# Patient Record
Sex: Female | Born: 1975 | Race: White | Hispanic: No | Smoking: Never smoker
Health system: Southern US, Community
[De-identification: ages and names within clinical notes are randomized; demographics above are authoritative.]

## PROBLEM LIST (undated history)

## (undated) DIAGNOSIS — B019 Varicella without complication: Secondary | ICD-10-CM

## (undated) DIAGNOSIS — K219 Gastro-esophageal reflux disease without esophagitis: Secondary | ICD-10-CM

## (undated) HISTORY — DX: Gastro-esophageal reflux disease without esophagitis: K21.9

## (undated) HISTORY — PX: TUBAL LIGATION: SHX77

## (undated) HISTORY — DX: Varicella without complication: B01.9

---

## 2006-05-05 HISTORY — PX: AUGMENTATION MAMMAPLASTY: SUR837

## 2011-05-06 HISTORY — PX: CHOLECYSTECTOMY: SHX55

## 2014-06-23 DIAGNOSIS — K219 Gastro-esophageal reflux disease without esophagitis: Secondary | ICD-10-CM | POA: Insufficient documentation

## 2015-01-30 DIAGNOSIS — N920 Excessive and frequent menstruation with regular cycle: Secondary | ICD-10-CM

## 2015-01-30 DIAGNOSIS — N921 Excessive and frequent menstruation with irregular cycle: Secondary | ICD-10-CM | POA: Insufficient documentation

## 2015-01-30 DIAGNOSIS — Z8041 Family history of malignant neoplasm of ovary: Secondary | ICD-10-CM | POA: Insufficient documentation

## 2015-01-30 HISTORY — DX: Excessive and frequent menstruation with regular cycle: N92.0

## 2018-02-19 LAB — HM PAP SMEAR: HM Pap smear: NORMAL

## 2020-05-15 ENCOUNTER — Ambulatory Visit: Payer: Self-pay | Admitting: Physician Assistant

## 2020-07-03 ENCOUNTER — Ambulatory Visit: Payer: Self-pay | Admitting: Family Medicine

## 2020-10-12 ENCOUNTER — Encounter: Payer: Self-pay | Admitting: Family Medicine

## 2020-10-12 ENCOUNTER — Other Ambulatory Visit: Payer: Self-pay

## 2020-10-12 ENCOUNTER — Ambulatory Visit (INDEPENDENT_AMBULATORY_CARE_PROVIDER_SITE_OTHER): Payer: Managed Care, Other (non HMO) | Admitting: Family Medicine

## 2020-10-12 VITALS — BP 118/74 | HR 60 | Temp 98.3°F | Ht 65.5 in | Wt 206.0 lb

## 2020-10-12 DIAGNOSIS — E611 Iron deficiency: Secondary | ICD-10-CM

## 2020-10-12 DIAGNOSIS — Z1211 Encounter for screening for malignant neoplasm of colon: Secondary | ICD-10-CM

## 2020-10-12 DIAGNOSIS — E669 Obesity, unspecified: Secondary | ICD-10-CM | POA: Insufficient documentation

## 2020-10-12 DIAGNOSIS — Z131 Encounter for screening for diabetes mellitus: Secondary | ICD-10-CM | POA: Diagnosis not present

## 2020-10-12 DIAGNOSIS — N921 Excessive and frequent menstruation with irregular cycle: Secondary | ICD-10-CM

## 2020-10-12 DIAGNOSIS — Z0000 Encounter for general adult medical examination without abnormal findings: Secondary | ICD-10-CM

## 2020-10-12 DIAGNOSIS — Z1159 Encounter for screening for other viral diseases: Secondary | ICD-10-CM

## 2020-10-12 DIAGNOSIS — Z1231 Encounter for screening mammogram for malignant neoplasm of breast: Secondary | ICD-10-CM

## 2020-10-12 LAB — CBC WITH DIFFERENTIAL/PLATELET
Basophils Absolute: 0.1 10*3/uL (ref 0.0–0.1)
Basophils Relative: 1.1 % (ref 0.0–3.0)
Eosinophils Absolute: 0.1 10*3/uL (ref 0.0–0.7)
Eosinophils Relative: 1.7 % (ref 0.0–5.0)
HCT: 39.5 % (ref 36.0–46.0)
Hemoglobin: 13.2 g/dL (ref 12.0–15.0)
Lymphocytes Relative: 33.4 % (ref 12.0–46.0)
Lymphs Abs: 1.8 10*3/uL (ref 0.7–4.0)
MCHC: 33.3 g/dL (ref 30.0–36.0)
MCV: 85.2 fl (ref 78.0–100.0)
Monocytes Absolute: 0.4 10*3/uL (ref 0.1–1.0)
Monocytes Relative: 7 % (ref 3.0–12.0)
Neutro Abs: 3 10*3/uL (ref 1.4–7.7)
Neutrophils Relative %: 56.8 % (ref 43.0–77.0)
Platelets: 289 10*3/uL (ref 150.0–400.0)
RBC: 4.64 Mil/uL (ref 3.87–5.11)
RDW: 13.8 % (ref 11.5–15.5)
WBC: 5.3 10*3/uL (ref 4.0–10.5)

## 2020-10-12 LAB — COMPREHENSIVE METABOLIC PANEL
ALT: 16 U/L (ref 0–35)
AST: 14 U/L (ref 0–37)
Albumin: 4.3 g/dL (ref 3.5–5.2)
Alkaline Phosphatase: 74 U/L (ref 39–117)
BUN: 11 mg/dL (ref 6–23)
CO2: 25 mEq/L (ref 19–32)
Calcium: 9.1 mg/dL (ref 8.4–10.5)
Chloride: 104 mEq/L (ref 96–112)
Creatinine, Ser: 0.78 mg/dL (ref 0.40–1.20)
GFR: 90.77 mL/min (ref >60.00)
Glucose, Bld: 111 mg/dL — ABNORMAL HIGH (ref 70–99)
Potassium: 4.6 mEq/L (ref 3.5–5.1)
Sodium: 137 mEq/L (ref 135–145)
Total Bilirubin: 0.5 mg/dL (ref 0.2–1.2)
Total Protein: 8 g/dL (ref 6.0–8.3)

## 2020-10-12 LAB — LIPID PANEL
Cholesterol: 200 mg/dL (ref 0–200)
HDL: 50.6 mg/dL (ref >39.00)
LDL Cholesterol: 129 mg/dL — ABNORMAL HIGH (ref 0–99)
NonHDL: 149.27
Total CHOL/HDL Ratio: 4
Triglycerides: 99 mg/dL (ref 0.0–149.0)
VLDL: 19.8 mg/dL (ref 0.0–40.0)

## 2020-10-12 LAB — TSH: TSH: 4.49 u[IU]/mL (ref 0.35–4.50)

## 2020-10-12 LAB — HEMOGLOBIN A1C: Hgb A1c MFr Bld: 5.7 % (ref 4.6–6.5)

## 2020-10-12 NOTE — Patient Instructions (Addendum)
Great to see you today.  .   If labs were collected, we will inform you of lab results once received either by echart message or telephone call.   - echart message- for normal results that have been seen by the patient already.   - telephone call: abnormal results or if patient has not viewed results in their echart.   Health Maintenance, Female Adopting a healthy lifestyle and getting preventive care are important in promoting health and wellness. Ask your health care provider about: The right schedule for you to have regular tests and exams. Things you can do on your own to prevent diseases and keep yourself healthy. What should I know about diet, weight, and exercise? Eat a healthy diet  Eat a diet that includes plenty of vegetables, fruits, low-fat dairy products, and lean protein. Do not eat a lot of foods that are high in solid fats, added sugars, or sodium.  Maintain a healthy weight Body mass index (BMI) is used to identify weight problems. It estimates body fat based on height and weight. Your health care provider can help determineyour BMI and help you achieve or maintain a healthy weight. Get regular exercise Get regular exercise. This is one of the most important things you can do for your health. Most adults should: Exercise for at least 150 minutes each week. The exercise should increase your heart rate and make you sweat (moderate-intensity exercise). Do strengthening exercises at least twice a week. This is in addition to the moderate-intensity exercise. Spend less time sitting. Even light physical activity can be beneficial. Watch cholesterol and blood lipids Have your blood tested for lipids and cholesterol at 47 years of age, then havethis test every 5 years. Have your cholesterol levels checked more often if: Your lipid or cholesterol levels are high. You are older than 47 years of age. You are at high risk for heart disease. What should I know about cancer  screening? Depending on your health history and family history, you may need to have cancer screening at various ages. This may include screening for: Breast cancer. Cervical cancer. Colorectal cancer. Skin cancer. Lung cancer. What should I know about heart disease, diabetes, and high blood pressure? Blood pressure and heart disease High blood pressure causes heart disease and increases the risk of stroke. This is more likely to develop in people who have high blood pressure readings, are of African descent, or are overweight. Have your blood pressure checked: Every 3-5 years if you are 33-54 years of age. Every year if you are 23 years old or older. Diabetes Have regular diabetes screenings. This checks your fasting blood sugar level. Have the screening done: Once every three years after age 61 if you are at a normal weight and have a low risk for diabetes. More often and at a younger age if you are overweight or have a high risk for diabetes. What should I know about preventing infection? Hepatitis B If you have a higher risk for hepatitis B, you should be screened for this virus. Talk with your health care provider to find out if you are at risk forhepatitis B infection. Hepatitis C Testing is recommended for: Everyone born from [birth year] through 1967. Anyone with known risk factors for hepatitis C. Sexually transmitted infections (STIs) Get screened for STIs, including gonorrhea and chlamydia, if: You are sexually active and are younger than 47 years of age. You are older than 47 years of age and your health care provider tells you that  you are at risk for this type of infection. Your sexual activity has changed since you were last screened, and you are at increased risk for chlamydia or gonorrhea. Ask your health care provider if you are at risk. Ask your health care provider about whether you are at high risk for HIV. Your health care provider may recommend a prescription medicine to  help prevent HIV infection. If you choose to take medicine to prevent HIV, you should first get tested for HIV. You should then be tested every 3 months for as long as you are taking the medicine. Pregnancy If you are about to stop having your period (premenopausal) and you may become pregnant, seek counseling before you get pregnant. Take 400 to 800 micrograms (mcg) of folic acid every day if you become pregnant. Ask for birth control (contraception) if you want to prevent pregnancy. Osteoporosis and menopause Osteoporosis is a disease in which the bones lose minerals and strength with aging. This can result in bone fractures. If you are 61 years old or older, or if you are at risk for osteoporosis and fractures, ask your health care provider if you should: Be screened for bone loss. Take a calcium or vitamin D supplement to lower your risk of fractures. Be given hormone replacement therapy (HRT) to treat symptoms of menopause. Follow these instructions at home: Lifestyle Do not use any products that contain nicotine or tobacco, such as cigarettes, e-cigarettes, and chewing tobacco. If you need help quitting, ask your health care provider. Do not use street drugs. Do not share needles. Ask your health care provider for help if you need support or information about quitting drugs. Alcohol use Do not drink alcohol if: Your health care provider tells you not to drink. You are pregnant, may be pregnant, or are planning to become pregnant. If you drink alcohol: Limit how much you use to 0-1 drink a day. Limit intake if you are breastfeeding. Be aware of how much alcohol is in your drink. In the [place] one drink equals one 12 oz bottle of beer (355 mL), one 5 oz glass of wine (148 mL), or one 1 oz glass of hard liquor (44 mL). General instructions Schedule regular health, dental, and eye exams. Stay current with your vaccines. Tell your health care provider if: You often feel depressed. You  have ever been abused or do not feel safe at home. Summary Adopting a healthy lifestyle and getting preventive care are important in promoting health and wellness. Follow your health care provider's instructions about healthy diet, exercising, and getting tested or screened for diseases. Follow your health care provider's instructions on monitoring your cholesterol and blood pressure. This information is not intended to replace advice given to you by your health care provider. Make sure you discuss any questions you have with your healthcare provider. Document Revised: 04/14/2018 Document Reviewed: 04/14/2018 Elsevier Patient Education  2022 [employer].    If you are age 9 or younger, your body mass index should be between 19-25. Your Body mass index is Body mass index is 33.76 kg/m. Marland Kitchen If this is above the aformentioned range listed, you are consider overwieght and obese if BMI > 30, by medical definition and standards. Routine daily exercise and dietary modifications are encouraged. If you would like additional guidance on weight loss, please make an appointment for weight loss counseling - must be an appointment dedicate to weight loss counseling alone. I would be happy to help you.

## 2020-10-12 NOTE — Progress Notes (Signed)
Patient ID: Christine Price, female  DOB: 1976/04/24, 45 y.o.   MRN: [id number] Patient Care Team    Relationship Specialty Notifications Start End  Ma Hillock, DO PCP - General Family Medicine  10/12/20     Chief Complaint  Patient presents with   Establish Care    Pt is fasting; no meds, no prior PCP in last 10 years, dr Harrell Gave scott nunes, no concerns    Subjective:  Christine Price is a 45 y.o.  female present for new patient establishment/cpe. All past medical history, surgical history, allergies, family history, immunizations, medications and social history were updated in the electronic medical record today. All recent labs, ED visits and hospitalizations within the last year were reviewed.  Health maintenance:  Colonoscopy: No family history of colon cancer.  Discussed different options today and patient would like to start with Cologuard testing.  Ordered for her today. Mammogram: completed:2020- GYN Cervical cancer screening: last pap: 09/2018- GYN, Immunizations: tdap - she believes last 10 yrs, Influenza (encouraged yearly),  covid x2 Infectious disease screening: HIV w/ preg, Hep C completed.  DEXA: routine screen Assistive device: none Oxygen KNL:ZJQB Patient has a Dental home. Hospitalizations/ED visits:review  Depression screen [facility] 2/9 10/12/2020  Decreased Interest 0  Down, Depressed, Hopeless 0  PHQ - 2 Score 0   No flowsheet data found.      No flowsheet data found.  Immunization History  Administered Date(s) Administered   PFIZER(Purple Top)SARS-COV-2 Vaccination 12/20/2019, 01/06/2020    No results found.  Past Medical History:  Diagnosis Date   Chicken pox    GERD (gastroesophageal reflux disease)    Menorrhagia 01/30/2015   No Known Allergies Past Surgical History:  Procedure Laterality Date   CHOLECYSTECTOMY  2013   TUBAL LIGATION     Family History  Problem Relation Age of Onset   Cancer Father    Drug abuse Sister    COPD  Sister    Learning disabilities Daughter    Cervical cancer Maternal Grandmother    Throat cancer Maternal Grandmother    Heart disease Maternal Grandfather    Lung cancer Maternal Grandfather        smoker   Coronary artery disease Maternal Grandfather    Social History   Social History Narrative   Marital status/children/pets: Divorced, 3 children   Education/employment: colleg educated, works in Programmer, applications.    Safety:      -smoke alarm in the home:Yes     - wears seatbelt: Yes     - Feels safe in their relationships: Yes       Allergies as of 10/12/2020   No Known Allergies      Medication List    as of October 12, 2020  8:43 AM   You have not been prescribed any medications.     All past medical history, surgical history, allergies, family history, immunizations andmedications were updated in the EMR today and reviewed under the history and medication portions of their EMR.    No results found for this or any previous visit (from the past 2160 hour(s)).  Patient was never admitted.   ROS: 14 pt review of systems performed and negative (unless mentioned in an HPI)  Objective: BP 118/74   Pulse 60   Temp 98.3 F (36.8 C) (Oral)   Ht 5' 5.5" (1.664 m)   Wt 206 lb (93.4 kg)   LMP 09/22/2020 (Approximate)   SpO2 98%   BMI 33.76 kg/m  Gen: Afebrile. No acute distress. Nontoxic in appearance, well-developed, well-nourished, very pleasant obese female HENT: AT. [place]. Bilateral TM visualized and normal in appearance, normal external auditory canal. MMM, no oral lesions, adequate dentition. Bilateral nares within normal limits. Throat without erythema, ulcerations or exudates.  No cough on exam, no hoarseness on exam. Eyes:Pupils Equal Round Reactive to light, Extraocular movements intact,  Conjunctiva without redness, discharge or icterus. Neck/lymp/endocrine: Supple, no lymphadenopathy, no thyromegaly CV: RRR no murmur, no edema, +2/4 P posterior tibialis pulses.   Chest: CTAB, no wheeze, rhonchi or crackles.  Normal respiratory effort.  Good air movement. Abd: Soft.  Flat. NTND. BS present.  No masses palpated. No hepatosplenomegaly. No rebound tenderness or guarding. Skin: No rashes, purpura or petechiae. Warm and well-perfused. Skin intact. Neuro/Msk:  Normal gait. PERLA. EOMi. Alert. Oriented x3.  Cranial nerves II through XII intact. Muscle strength 5/5 upper/lower extremity. DTRs equal bilaterally. Psych: Normal affect, dress and demeanor. Normal speech. Normal thought content and judgment.   Assessment/plan: Christine Price is a 45 y.o. female present for establish care/CPE Obesity (BMI 30-39.9) Routine exercise and dietary modification encouraged - Lipid panel Diabetes mellitus screening - Comp Met (CMET) - Hemoglobin A1c  Menorrhagia with irregular cycle/iron deficiency anemia Is having heavy and irregular periods she reports she did have an endometrial biopsy in the past. - CBC w/Diff - TSH - Ambulatory referral to Gynecology Iron deficiency - Iron, TIBC and Ferritin Panel Encounter for hepatitis C screening test for low risk patient - Hepatitis C Antibody Colon cancer screening - Cologuard Encounter for screening mammogram for malignant neoplasm of breast - MM 3D SCREEN BREAST BILATERAL; Future  Routine general medical examination at a health care facility/Encounter for medical examination to establish care Colonoscopy: No family history of colon cancer.  Discussed different options today and patient would like to start with Cologuard testing.  Ordered for her today. Mammogram: completed:2020- GYN Cervical cancer screening: last pap: 09/2018- GYN, Immunizations: tdap - she believes last 10 yrs, Influenza (encouraged yearly),  covid x2 Infectious disease screening: HIV w/ preg, Hep C completed.  DEXA: routine screen Patient was encouraged to exercise greater than 150 minutes a week. Patient was encouraged to choose a diet filled  with fresh fruits and vegetables, and lean meats. AVS provided to patient today for education/recommendation on gender specific health and safety maintenance. Return in about 1 year (around 10/12/2021) for CPE (30 min).  Orders Placed This Encounter  Procedures   CBC w/Diff   Comp Met (CMET)   TSH   Hemoglobin A1c   Lipid panel    No orders of the defined types were placed in this encounter.  Referral Orders  No referral(s) requested today     Note is dictated utilizing voice recognition software. Although note has been proof read prior to signing, occasional typographical errors still can be missed. If any questions arise, please do not hesitate to call for verification.  Electronically signed by: Howard Pouch, DO [facility]

## 2020-10-15 ENCOUNTER — Telehealth: Payer: Self-pay | Admitting: Family Medicine

## 2020-10-15 DIAGNOSIS — R768 Other specified abnormal immunological findings in serum: Secondary | ICD-10-CM

## 2020-10-15 NOTE — Telephone Encounter (Signed)
Pt called. Discussed  positive Hep C test.  Referred to ID.

## 2020-10-18 ENCOUNTER — Other Ambulatory Visit (HOSPITAL_COMMUNITY): Payer: Self-pay

## 2020-10-18 ENCOUNTER — Telehealth: Payer: Self-pay

## 2020-10-18 ENCOUNTER — Ambulatory Visit (INDEPENDENT_AMBULATORY_CARE_PROVIDER_SITE_OTHER): Payer: Managed Care, Other (non HMO)

## 2020-10-18 ENCOUNTER — Other Ambulatory Visit: Payer: Self-pay

## 2020-10-18 DIAGNOSIS — Z1231 Encounter for screening mammogram for malignant neoplasm of breast: Secondary | ICD-10-CM

## 2020-10-18 NOTE — Telephone Encounter (Signed)
[facility] Encounter  Insurance verification completed.    The patient is insured through [employer].  Medication will need a PA.  We will continue to follow to see if copay assistance is needed.  Cristela Stalder, CPhT [facility] Phone: [phone] Fax:  [phone]  

## 2020-10-19 ENCOUNTER — Other Ambulatory Visit: Payer: Self-pay

## 2020-10-19 ENCOUNTER — Ambulatory Visit (INDEPENDENT_AMBULATORY_CARE_PROVIDER_SITE_OTHER): Payer: Managed Care, Other (non HMO) | Admitting: Family

## 2020-10-19 ENCOUNTER — Encounter: Payer: Self-pay | Admitting: Family

## 2020-10-19 VITALS — BP 146/80 | HR 58 | Temp 97.9°F | Wt 208.0 lb

## 2020-10-19 DIAGNOSIS — R768 Other specified abnormal immunological findings in serum: Secondary | ICD-10-CM | POA: Diagnosis not present

## 2020-10-19 LAB — IRON,TIBC AND FERRITIN PANEL
%SAT: 27 % (calc) (ref 16–45)
Ferritin: 16 ng/mL (ref 16–232)
Iron: 93 ug/dL (ref 40–190)
TIBC: 346 mcg/dL (calc) (ref 250–450)

## 2020-10-19 LAB — HEPATITIS C ANTIBODY
Hepatitis C Ab: REACTIVE — AB
SIGNAL TO CUT-OFF: 7.29 — ABNORMAL HIGH (ref <1.00)

## 2020-10-19 LAB — HEPATITIS C RNA QUANTITATIVE
HCV Quantitative Log: <1.18 Log IU/mL
HCV RNA, PCR, QN: <15 IU/mL

## 2020-10-19 NOTE — Progress Notes (Signed)
Subjective:    Patient ID: Christine Price, female    DOB: February 19, 1976, 45 y.o.   MRN: [id number]  Chief Complaint  Patient presents with   New Patient (Initial Visit)    New patient , discuss hep c treatment     HPI:  Christine Price is a 45 y.o. female with previous medical history of GERD and iron deficiency presenting today for evaluation and treatment of Hepatitis C.   Christine Price was recently seen by her Primary Care Provider for routine general medical care and was found to have a positive Hepatitis C antibody test. Subsequent Hepatitis C RNA level was undetectable. This is her initial diagnosis with positive Hepatitis C antibody test. Risk factors may possibly include sharing of razors otherwise denies IV drug use, blood transfusions prior to 1992, tattoos or known contact with positive partner. Has not received treatment. No symptoms including abdominal pain, nausea, vomiting, diarrhea, scleral icterus or jaundice. No personal or family history of liver disease. No current recreational/illicit drug use or tobacco use and occasional alcohol consumption.    No Known Allergies    No outpatient medications prior to visit.   No facility-administered medications prior to visit.     Past Medical History:  Diagnosis Date   Chicken pox    GERD (gastroesophageal reflux disease)    Menorrhagia 01/30/2015      Past Surgical History:  Procedure Laterality Date   AUGMENTATION MAMMAPLASTY Bilateral 2008   CHOLECYSTECTOMY  2013   TUBAL LIGATION        Family History  Problem Relation Age of Onset   Cancer Father        unknown   Drug abuse Sister    COPD Sister    Cervical cancer Maternal Grandmother    Throat cancer Maternal Grandmother    Heart disease Maternal Grandfather    Lung cancer Maternal Grandfather        smoker   Coronary artery disease Maternal Grandfather    Learning disabilities Daughter       Social History   Socioeconomic History   Marital status:  Divorced    Spouse name: Not on file   Number of children: Not on file   Years of education: Not on file   Highest education level: Not on file  Occupational History   Not on file  Tobacco Use   Smoking status: Never   Smokeless tobacco: Never  Vaping Use   Vaping Use: Never used  Substance and Sexual Activity   Alcohol use: Yes    Alcohol/week: 3.0 standard drinks    Types: 3 Glasses of wine per week   Drug use: Not Currently   Sexual activity: Not Currently    Partners: Male  Other Topics Concern   Not on file  Social History Narrative   Marital status/children/pets: Divorced, 3 children   Education/employment: colleg educated, works in Presenter, broadcasting.    Safety:      -smoke alarm in the home:Yes     - wears seatbelt: Yes     - Feels safe in their relationships: Yes      Social Determinants of Health   Financial Resource Strain: Not on file  Food Insecurity: Not on file  Transportation Needs: Not on file  Physical Activity: Not on file  Stress: Not on file  Social Connections: Not on file  Intimate Partner Violence: Not on file      Review of Systems  Constitutional:  Negative for chills, fatigue, fever and  unexpected weight change.  Respiratory:  Negative for cough, chest tightness, shortness of breath and wheezing.   Cardiovascular:  Negative for chest pain and leg swelling.  Gastrointestinal:  Negative for abdominal distention, constipation, diarrhea, nausea and vomiting.  Neurological:  Negative for dizziness, weakness, light-headedness and headaches.  Hematological:  Does not bruise/bleed easily.      Objective:    BP (!) 146/80   Pulse (!) 58   Temp 97.9 F (36.6 C)   Wt 208 lb (94.3 kg)   LMP 09/22/2020 (Approximate)   SpO2 100%   BMI 34.09 kg/m  Nursing note and vital signs reviewed.  Physical Exam Constitutional:      General: She is not in acute distress.    Appearance: She is well-developed.  Cardiovascular:     Rate and Rhythm:  Normal rate and regular rhythm.     Heart sounds: Normal heart sounds. No murmur heard.   No friction rub. No gallop.  Pulmonary:     Effort: Pulmonary effort is normal. No respiratory distress.     Breath sounds: Normal breath sounds. No wheezing or rales.  Chest:     Chest wall: No tenderness.  Abdominal:     General: Bowel sounds are normal. There is no distension.     Palpations: Abdomen is soft. There is no mass.     Tenderness: There is no abdominal tenderness. There is no guarding or rebound.  Skin:    General: Skin is warm and dry.  Neurological:     Mental Status: She is alert and oriented to person, place, and time.  Psychiatric:        Behavior: Behavior normal.        Thought Content: Thought content normal.        Judgment: Judgment normal.        Assessment & Plan:   Patient Active Problem List   Diagnosis Date Noted   Hepatitis C antibody test positive 10/15/2020   Obesity (BMI 30-39.9) 10/12/2020   Iron deficiency 10/12/2020   Routine general medical examination at a health care facility 10/12/2020   Menorrhagia with irregular cycle 01/30/2015   Family history of malignant neoplasm of ovary 01/30/2015   Gastroesophageal reflux disease 06/23/2014     Problem List Items Addressed This Visit   None    Suesan Mohrmann does not currently have medications on file.   Follow-up: As needed.     Marcos Eke, MSN, FNP-C Nurse Practitioner [facility] Main number: [phone]

## 2020-10-19 NOTE — Patient Instructions (Addendum)
Nice to meet you.   You have a Hepatitis C positive antibody test and Hepaitis C RNA level present meaning you likely cleared the Hepatitis C in the past independently.  No treatment is needed.   If you need to be tested in the future you will need to be have the Hepatitis C RNA level tested (viral load) as the Hepatitis C antibody test will always be positive.  No follow up with ID needed.  Have a great day and stay safe!

## 2020-10-19 NOTE — Assessment & Plan Note (Signed)
Christine Price is a 45 y/o caucasian female with positive Hepatitis C antibody test during recent routine general medical exam with minimal risk factors present for Hepatitis C. RNA level is undetectable indicating that she likely had Hepatitis C in the past and is one of the 25% of individuals that cleared it independently. We discussed the nature of Hepatitis C and how to prevent it. No treatment or follow up is necessary as the virus is not present. If future screening is indicated Hepatitis C RNA level will need to be checked as the Hepatitis C antibody test will always be positive. Follow up with ID as needed.

## 2021-10-14 ENCOUNTER — Encounter: Payer: Managed Care, Other (non HMO) | Admitting: Family Medicine

## 2022-06-09 IMAGING — MG DIGITAL SCREENING BREAST BILAT IMPLANT W/ TOMO W/ CAD
8 of 12 series · 8 of 28 positions shown · non-contrast
Comparison: Previous exam(s).

ACR Breast Density Category a: The breast tissue is almost entirely
fatty.

CLINICAL DATA: Screening.

EXAM:
DIGITAL SCREENING BILATERAL MAMMOGRAM WITH IMPLANTS, CAD AND
TOMOSYNTHESIS
TECHNIQUE: Bilateral screening digital craniocaudal and mediolateral oblique
mammograms were obtained. Bilateral screening digital breast
tomosynthesis was performed. The images were evaluated with
computer-aided detection. Standard and/or implant displaced views
were performed.

[R MLO]
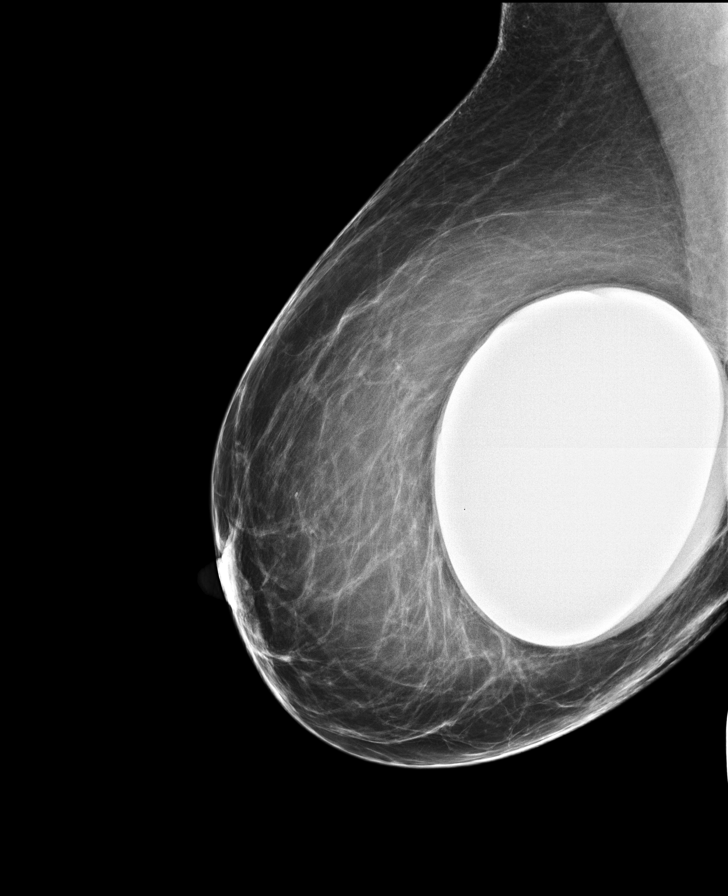

[R CC]
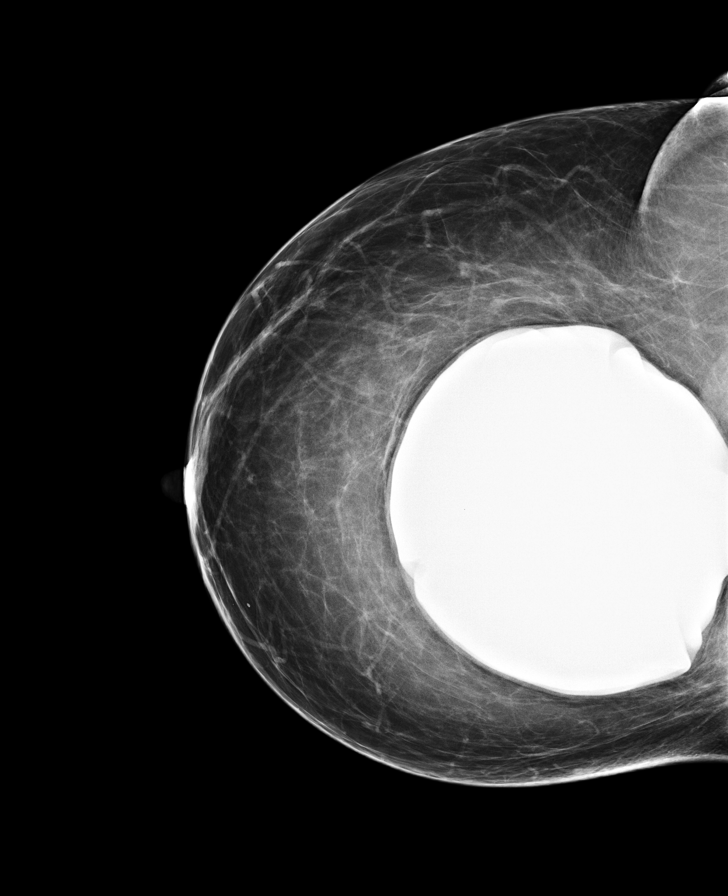

[L MLO]
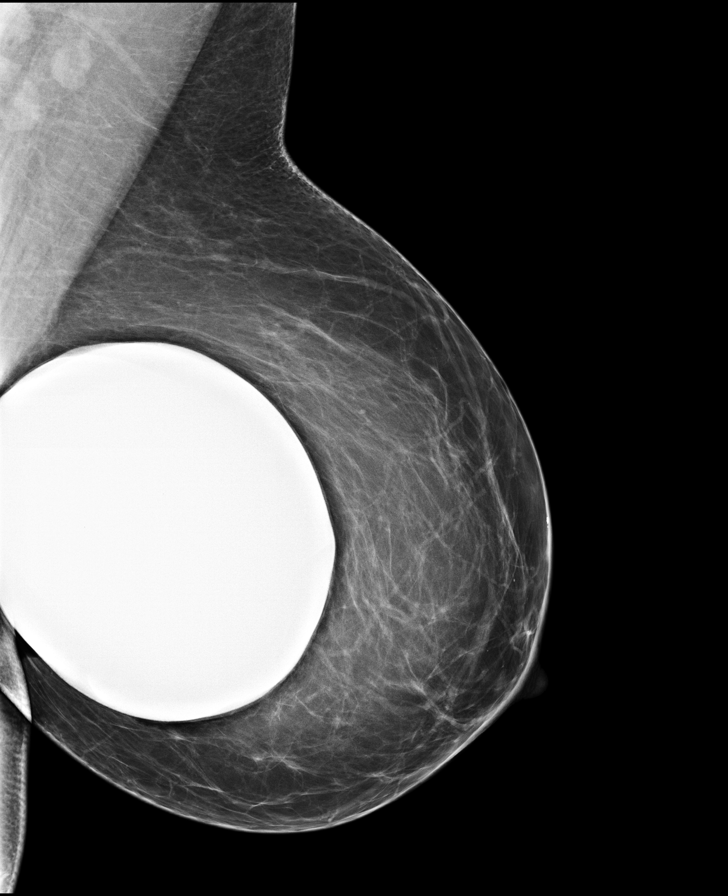

[L CC]
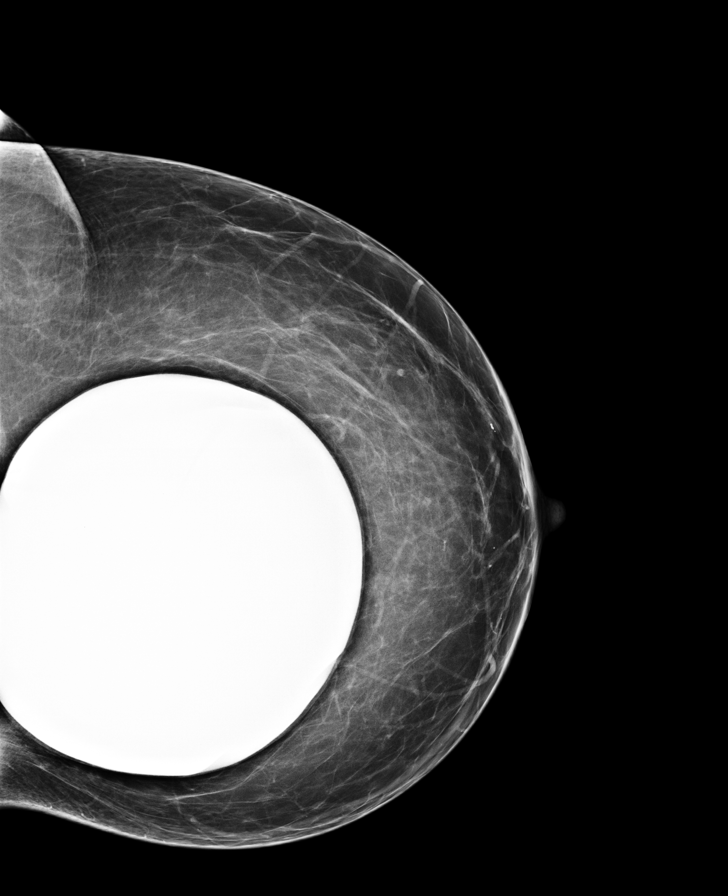

[L MLO synth-2D]
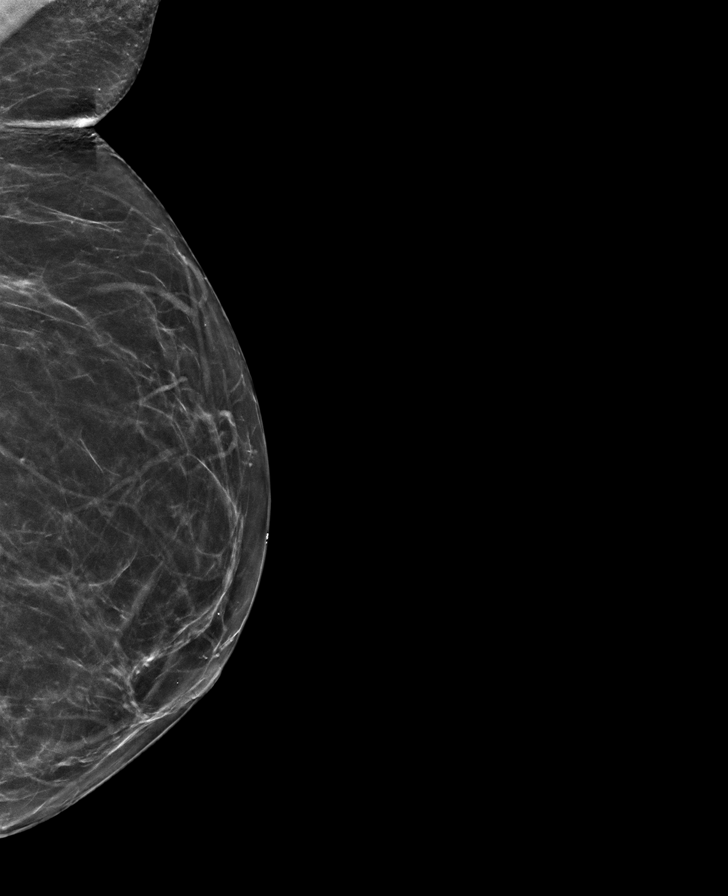

[R MLO synth-2D]
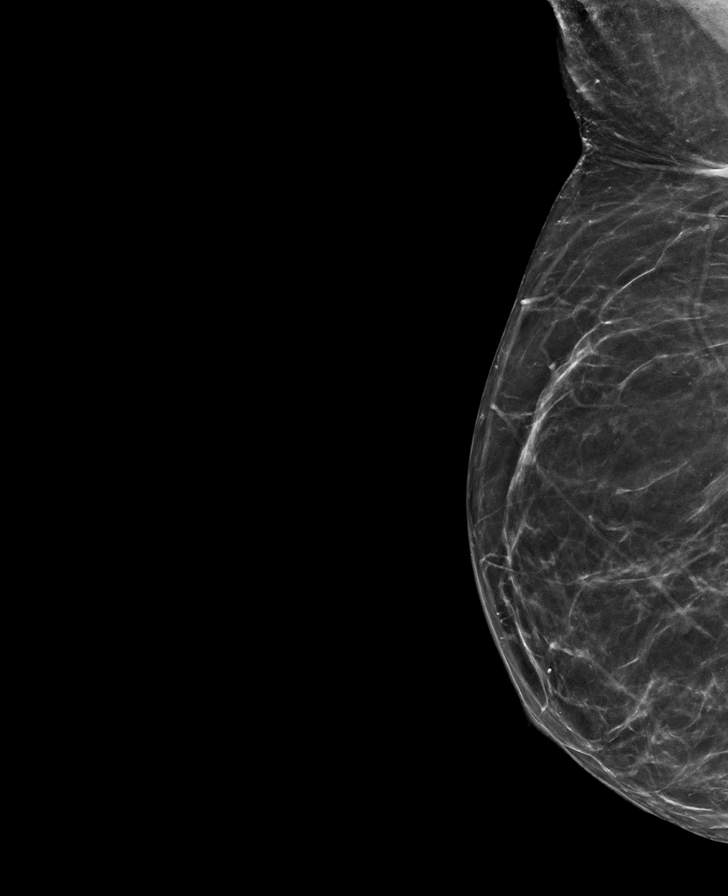

[R CC synth-2D]
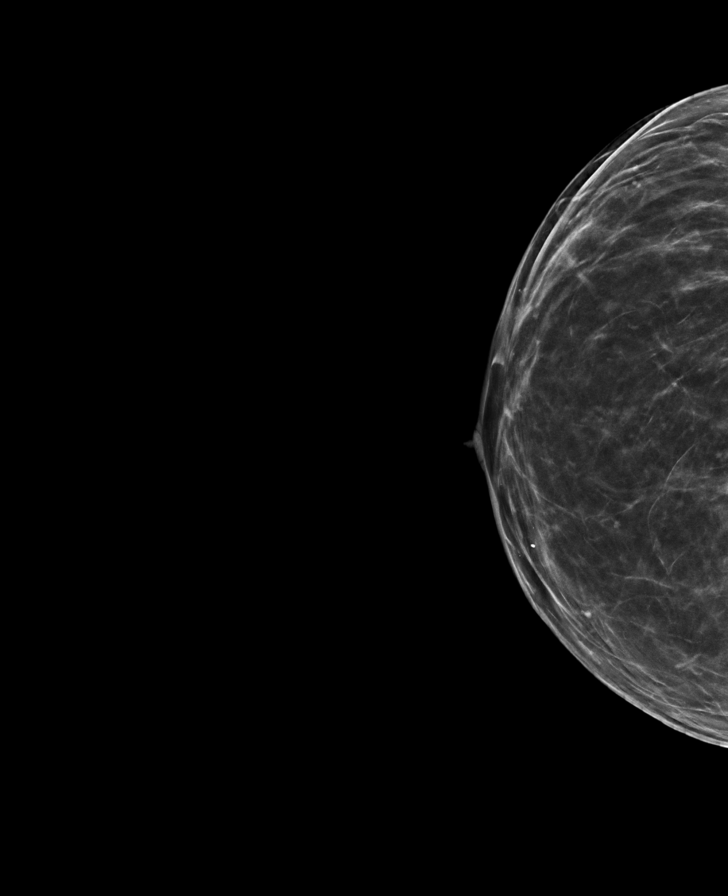

[L CC synth-2D]
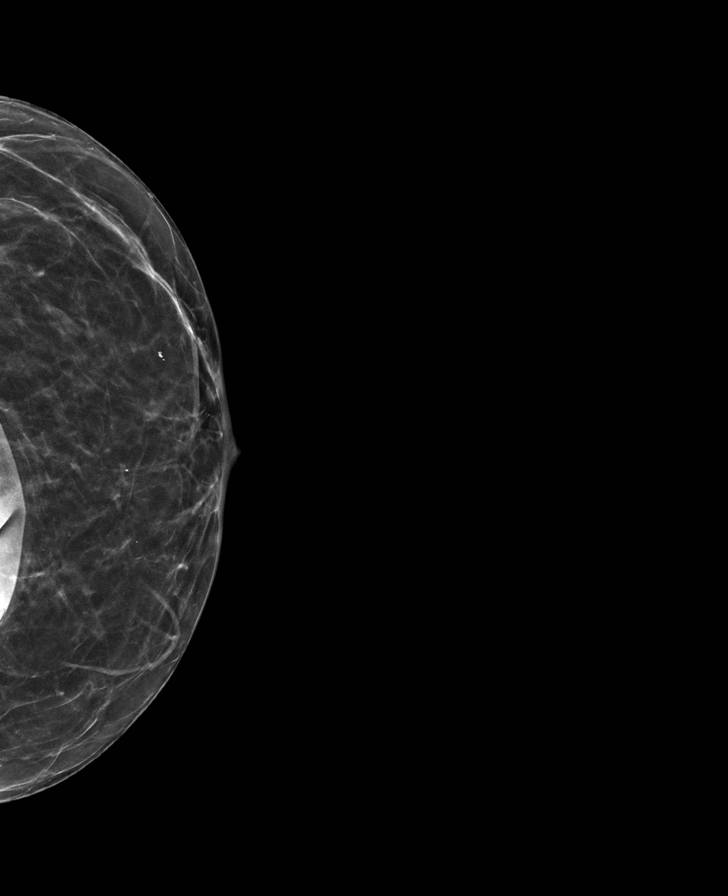

[8 of 28 positions shown; findings below may reference images not displayed]

FINDINGS: There are no findings suspicious for malignancy.
IMPRESSION: No mammographic evidence of malignancy. A result letter of this
screening mammogram will be mailed directly to the patient.

RECOMMENDATION:
Screening mammogram in one year. (Code:41-V-YIL)

BI-RADS CATEGORY  1: Negative.
